# Patient Record
Sex: Female | Born: 1980 | Race: White | Hispanic: No | Marital: Married | Smoking: Never smoker
Health system: Southern US, Community
[De-identification: ages and names within clinical notes are randomized; demographics above are authoritative.]

## PROBLEM LIST (undated history)

## (undated) DIAGNOSIS — M069 Rheumatoid arthritis, unspecified: Secondary | ICD-10-CM

## (undated) DIAGNOSIS — E282 Polycystic ovarian syndrome: Secondary | ICD-10-CM

## (undated) HISTORY — PX: FRACTURE SURGERY: SHX138

## (undated) HISTORY — PX: ABDOMINAL SURGERY: SHX537

## (undated) HISTORY — PX: KNEE SURGERY: SHX244

---

## 2021-10-20 ENCOUNTER — Ambulatory Visit
Admission: RE | Admit: 2021-10-20 | Discharge: 2021-10-20 | Disposition: A | Discharge status: Home / Self Care | Payer: Managed Care, Other (non HMO) | Attending: Family Medicine | Admitting: Family Medicine

## 2021-10-20 ENCOUNTER — Ambulatory Visit
Admission: RE | Admit: 2021-10-20 | Discharge: 2021-10-20 | Disposition: A | Discharge status: Home / Self Care | Payer: Managed Care, Other (non HMO) | Source: Ambulatory Visit | Attending: Family Medicine | Admitting: Family Medicine

## 2021-10-20 ENCOUNTER — Other Ambulatory Visit: Payer: Self-pay | Admitting: Family Medicine

## 2021-10-20 DIAGNOSIS — R0602 Shortness of breath: Secondary | ICD-10-CM | POA: Insufficient documentation

## 2021-10-20 DIAGNOSIS — J40 Bronchitis, not specified as acute or chronic: Secondary | ICD-10-CM

## 2021-10-20 DIAGNOSIS — R059 Cough, unspecified: Secondary | ICD-10-CM | POA: Diagnosis present

## 2022-04-30 ENCOUNTER — Encounter: Payer: Self-pay | Admitting: Emergency Medicine

## 2022-04-30 ENCOUNTER — Ambulatory Visit
Admission: EM | Admit: 2022-04-30 | Discharge: 2022-04-30 | Disposition: A | Discharge status: Home / Self Care | Payer: Managed Care, Other (non HMO)

## 2022-04-30 DIAGNOSIS — J069 Acute upper respiratory infection, unspecified: Secondary | ICD-10-CM | POA: Diagnosis not present

## 2022-04-30 DIAGNOSIS — H6502 Acute serous otitis media, left ear: Secondary | ICD-10-CM

## 2022-04-30 HISTORY — DX: Polycystic ovarian syndrome: E28.2

## 2022-04-30 HISTORY — DX: Rheumatoid arthritis, unspecified: M06.9

## 2022-04-30 MED ORDER — PREDNISONE 20 MG PO TABS: 40.0000 mg | ORAL_TABLET | Freq: Every day | ORAL | 0 refills | Status: DC

## 2022-04-30 MED ORDER — BENZONATATE 100 MG PO CAPS: 100.0000 mg | ORAL_CAPSULE | Freq: Three times a day (TID) | ORAL | 0 refills | Status: DC

## 2022-04-30 MED ORDER — HYDROCOD POLI-CHLORPHE POLI ER 10-8 MG/5ML PO SUER: 5.0000 mL | Freq: Two times a day (BID) | ORAL | 0 refills | Status: DC | PRN

## 2022-04-30 MED ORDER — AMOXICILLIN 500 MG PO CAPS: 500.0000 mg | ORAL_CAPSULE | Freq: Two times a day (BID) | ORAL | 0 refills | Status: AC

## 2022-04-30 NOTE — ED Triage Notes (Signed)
Patient c/o cough and chest congestion for a week.  Patient also reports nasal congestion and pressure.  Patient denies fevers.

## 2022-04-30 NOTE — Discharge Instructions (Signed)
On exam there is redness to your left eardrum which is a sign of bacteria, take amoxicillin every morning and every evening for 10 days  For your wheezing and shortness of breath, begin prednisone every morning with food for 5 days  May use Tessalon pill every 8 hours as needed for cough  May use cough syrup twice daily as needed, be mindful this medication may make you drowsy  Continue use of inhaler    You can take Tylenol and/or Ibuprofen as needed for fever reduction and pain relief.   For cough: honey 1/2 to 1 teaspoon (you can dilute the honey in water or another fluid).  You can also use guaifenesin and dextromethorphan for cough. You can use a humidifier for chest congestion and cough.  If you don't have a humidifier, you can sit in the bathroom with the hot shower running.      For sore throat: try warm salt water gargles, cepacol lozenges, throat spray, warm tea or water with lemon/honey, popsicles or ice, or OTC cold relief medicine for throat discomfort.   For congestion: take a daily anti-histamine like Zyrtec, Claritin, and a oral decongestant, such as pseudoephedrine.  You can also use Flonase 1-2 sprays in each nostril daily.   It is important to stay hydrated: drink plenty of fluids (water, gatorade/powerade/pedialyte, juices, or teas) to keep your throat moisturized and help further relieve irritation/discomfort.

## 2022-04-30 NOTE — ED Provider Notes (Signed)
[facility]    CSN: [id number] Arrival date & time: 04/30/22  1108      History   Chief Complaint Chief Complaint  Patient presents with   Cough    HPI Katherine Galloway is a 41 y.o. female.   Patient presents with nasal congestion, rhinorrhea, sore throat, a nonproductive cough, intermittent shortness of breath with exertion and intermittent wheezing for 8 days.  Has begun to experience bilateral ear pain and fullness beginning 1 day ago.  No known exposure.  Tolerating food and liquids.  Denies fever, chills, body aches.  Has attempted use of albuterol inhaler which has been helpful.  Has attempted use of Promethazine DM which has been ineffective.  History of PCOS and rheumatoid arthritis.    Past Medical History:  Diagnosis Date   PCOS (polycystic ovarian syndrome)    Rheumatoid arthritis ([place])     There are no problems to display for this patient.   Past Surgical History:  Procedure Laterality Date   ABDOMINAL SURGERY     FRACTURE SURGERY     KNEE SURGERY Left     OB History   No obstetric history on file.      Home Medications    Prior to Admission medications   Medication Sig Start Date End Date Taking? Authorizing Provider  albuterol (VENTOLIN HFA) 108 (90 Base) MCG/ACT inhaler Inhale into the lungs. 09/30/21 09/30/22 Yes [provider]  Dietary Management Product (RHEUMATE) CAPS 1 capsule by oral route once daily 10/19/21  Yes [provider]  diltiazem (CARDIZEM) 30 MG tablet Take by mouth. 01/20/22 01/20/23 Yes [provider]  metFORMIN (GLUCOPHAGE) 500 MG tablet Take 1,000 mg by mouth 2 (two) times daily with a meal.   Yes [provider]  metoprolol succinate (TOPROL-XL) 25 MG 24 hr tablet Take by mouth. 07/07/21  Yes [provider]  montelukast (SINGULAIR) 10 MG tablet take 1 tablet by oral route nightly 01/13/22  Yes [provider]  fexofenadine-pseudoephedrine (ALLEGRA-D) 60-120 MG 12 hr  tablet Take by mouth.    [provider]  FLUoxetine (PROZAC) 20 MG capsule Take 20 mg by mouth daily. 03/19/22   [provider]  Multiple Vitamin (MULTI-VITAMIN) tablet Take by mouth.    [provider]  RASUVO 20 MG/0.4ML SOAJ Inject into the skin. 02/08/22   [provider]  rizatriptan (MAXALT) 5 MG tablet TAKE 1 TAB AS NEEDED FOR MIGRAINE FOR UP TO 1 DOSE MAY TAKE A SECOND DOSE AFTER 2 HOURS IF NEEDED.    [provider]  XELJANZ XR 11 MG TB24 Take 1 tablet by mouth daily. 01/26/22   [provider]    Family History History reviewed. No pertinent family history.  Social History Social History   Tobacco Use   Smoking status: Never   Smokeless tobacco: Never  Vaping Use   Vaping Use: Never used  Substance Use Topics   Alcohol use: Yes   Drug use: Never     Allergies   Patient has no known allergies.   Review of Systems Review of Systems  HENT:  Positive for congestion, ear pain, rhinorrhea and sore throat. Negative for dental problem, drooling, ear discharge, facial swelling, hearing loss, mouth sores, nosebleeds, postnasal drip, sinus pressure, sinus pain, sneezing, tinnitus, trouble swallowing and voice change.   Respiratory:  Positive for cough, shortness of breath and wheezing. Negative for apnea, choking, chest tightness and stridor.      Physical Exam Triage Vital Signs ED  Triage Vitals  Enc Vitals Group     BP 04/30/22 1121 (!) 143/83     Pulse Rate 04/30/22 1121 71     Resp 04/30/22 1121 14     Temp 04/30/22 1121 97.7 F (36.5 C)     Temp Source 04/30/22 1121 Oral     SpO2 04/30/22 1121 96 %     Weight 04/30/22 1115 235 lb (106.6 kg)     Height 04/30/22 1115 5\' 8"  (1.727 m)     Head Circumference --      Peak Flow --      Pain Score 04/30/22 1115 0     Pain Loc --      Pain Edu? --      Excl. in GC? --    No data found.  Updated Vital Signs BP (!) 143/83 (BP Location: Left Arm)   Pulse 71    Temp 97.7 F (36.5 C) (Oral)   Resp 14   Ht 5\' 8"  (1.727 m)   Wt 235 lb (106.6 kg)   LMP 04/28/2022 (Exact Date)   SpO2 96%   BMI 35.73 kg/m   Visual Acuity Right Eye Distance:   Left Eye Distance:   Bilateral Distance:    Right Eye Near:   Left Eye Near:    Bilateral Near:     Physical Exam Constitutional:      Appearance: Normal appearance.  HENT:     Head: Normocephalic.     Right Ear: Hearing, ear canal and external ear normal. A middle ear effusion is present.     Left Ear: Hearing, ear canal and external ear normal. Tympanic membrane is erythematous.     Nose: Congestion and rhinorrhea present.     Mouth/Throat:     Mouth: Mucous membranes are moist.     Pharynx: Oropharynx is clear.  Eyes:     Extraocular Movements: Extraocular movements intact.  Cardiovascular:     Rate and Rhythm: Normal rate and regular rhythm.     Pulses: Normal pulses.     Heart sounds: Normal heart sounds.  Pulmonary:     Effort: Pulmonary effort is normal.     Breath sounds: Normal breath sounds.  Neurological:     Mental Status: She is alert and oriented to person, place, and time. Mental status is at baseline.  Psychiatric:        Mood and Affect: Mood normal.        Behavior: Behavior normal.      UC Treatments / Results  Labs (all labs ordered are listed, but only abnormal results are displayed) Labs Reviewed - No data to display  EKG   Radiology No results found.  Procedures Procedures (including critical care time)  Medications Ordered in UC Medications - No data to display  Initial Impression / Assessment and Plan / UC Course  I have reviewed the triage vital signs and the nursing notes.  Pertinent labs & imaging results that were available during my care of the patient were reviewed by me and considered in my medical decision making (see chart for details).  Nonrecurrent acute serous otitis media of left ear, viral URI with cough  Patient is in no signs of  distress nor toxic appearing.  Vital signs are stable.  Low suspicion for pneumonia, pneumothorax or bronchitis and therefore will defer imaging.  Etiology of symptoms is most likely viral however on exam there is significant erythema to the left tympanic membrane and therefore antibiotics will be  started  Prescribed amoxicillin and prescribed prednisone, Tessalon and Tussionex for management of coughing   May use additional over-the-counter medications as needed for supportive care.  May follow-up with urgent care as needed if symptoms persist or worsen.   Final Clinical Impressions(s) / UC Diagnoses   Final diagnoses:  None   Discharge Instructions   None    ED Prescriptions   None    PDMP not reviewed this encounter.   Valinda Hoar, NP 04/30/22 1155

## 2023-01-14 ENCOUNTER — Ambulatory Visit
Admission: EM | Admit: 2023-01-14 | Discharge: 2023-01-14 | Disposition: A | Discharge status: Home / Self Care | Payer: Managed Care, Other (non HMO)

## 2023-01-14 ENCOUNTER — Encounter: Payer: Self-pay | Admitting: *Deleted

## 2023-01-14 ENCOUNTER — Other Ambulatory Visit: Payer: Self-pay

## 2023-01-14 DIAGNOSIS — J069 Acute upper respiratory infection, unspecified: Secondary | ICD-10-CM | POA: Diagnosis not present

## 2023-01-14 MED ORDER — PROMETHAZINE-DM 6.25-15 MG/5ML PO SYRP: 5.0000 mL | ORAL_SOLUTION | Freq: Four times a day (QID) | ORAL | 0 refills | Status: AC | PRN

## 2023-01-14 MED ORDER — BENZONATATE 100 MG PO CAPS: 200.0000 mg | ORAL_CAPSULE | Freq: Three times a day (TID) | ORAL | 0 refills | Status: AC | PRN

## 2023-01-14 NOTE — ED Triage Notes (Signed)
Pt reports she has had a summer cold with a cough. Pt reports cough is getting worse.

## 2023-01-14 NOTE — Discharge Instructions (Addendum)
-  Take Tessalon Perles 200 mg every 8 hours for cough. -Take promethazine DM every 4 hours as needed for cough for 5 days -Continue using the over-the-counter nasal spray prescribed by your other providers for nasal congestion. -Report new or worsening symptoms to your primary care provider or local emergency room.

## 2023-01-14 NOTE — ED Provider Notes (Signed)
[facility]    CSN: [id number] Arrival date & time: 01/14/23  1014      History   Chief Complaint Chief Complaint  Patient presents with   Cough   HPI Katherine Galloway is a 42 y.o. female presents the urgent care this morning with complaints of cough.  Patient reports that symptoms started about 12 days ago as she was also having runny nose which resolved but continues to have dry cough to the point it is preventing her from sleeping at night. Patient reports her husband got sick with the same symptoms, but has already recovered. Patient continues to have increased dry cough and some neck muscle tenderness from coughing.  She took Mucinex DM for 1 week, not anymore, she is currently taking NyQuil and DayQuil with minimal relief. Reports no fever, chest pain, chills, allergies, nausea or vomiting.  She feels tired from a coughing as she stays awake due to increased cough.  Patient reports seasonal allergies as well.  She has a past medical history of PCOS, and rheumatoid arthritis with upcoming appointment with a new rheumatologist in October.  Reports using a spray from over-the-counter.  Past Medical History:  Diagnosis Date   PCOS (polycystic ovarian syndrome)    Rheumatoid arthritis ([facility])     There are no problems to display for this patient.   Past Surgical History:  Procedure Laterality Date   ABDOMINAL SURGERY     FRACTURE SURGERY     KNEE SURGERY Left     OB History   No obstetric history on file.      Home Medications    Prior to Admission medications   Medication Sig Start Date End Date Taking? Authorizing Provider  benzonatate (TESSALON) 100 MG capsule Take 2 capsules (200 mg total) by mouth 3 (three) times daily as needed for up to 7 days for cough. 01/14/23 01/21/23 Yes Eleonore Chiquito, FNP  diltiazem (CARDIZEM) 30 MG tablet Take by mouth. 01/20/22 01/20/23 Yes [provider]  fexofenadine-pseudoephedrine (ALLEGRA-D) 60-120 MG 12 hr tablet Take  by mouth.   Yes [provider]  leflunomide (ARAVA) 20 MG tablet Take 20 mg by mouth daily.   Yes [provider]  metoprolol succinate (TOPROL-XL) 25 MG 24 hr tablet Take by mouth. 07/07/21  Yes [provider]  montelukast (SINGULAIR) 10 MG tablet take 1 tablet by oral route nightly 01/13/22  Yes [provider]  Multiple Vitamin (MULTI-VITAMIN) tablet Take by mouth.   Yes [provider]  promethazine-dextromethorphan (PROMETHAZINE-DM) 6.25-15 MG/5ML syrup Take 5 mLs by mouth 4 (four) times daily as needed for up to 5 days for cough. 01/14/23 01/19/23 Yes Eleonore Chiquito, FNP  rizatriptan (MAXALT) 5 MG tablet TAKE 1 TAB AS NEEDED FOR MIGRAINE FOR UP TO 1 DOSE MAY TAKE A SECOND DOSE AFTER 2 HOURS IF NEEDED.   Yes [provider]  XELJANZ XR 11 MG TB24 Take 1 tablet by mouth daily. 01/26/22  Yes [provider]  albuterol (VENTOLIN HFA) 108 (90 Base) MCG/ACT inhaler Inhale into the lungs. 09/30/21 09/30/22  [provider]  Dietary Management Product (RHEUMATE) CAPS 1 capsule by oral route once daily 10/19/21   [provider]  FLUoxetine (PROZAC) 20 MG capsule Take 20 mg by mouth daily. 03/19/22   [provider]  metFORMIN (GLUCOPHAGE) 500 MG tablet Take 1,000 mg by mouth 2 (two) times daily with a meal.    [provider]  predniSONE (DELTASONE) 20 MG tablet Take 2 tablets (  40 mg total) by mouth daily. 04/30/22   White, Adrienne R, NP  RASUVO 20 MG/0.4ML SOAJ Inject into the skin. 02/08/22   [provider]    Family History History reviewed. No pertinent family history.  Social History Social History   Tobacco Use   Smoking status: Never   Smokeless tobacco: Never  Vaping Use   Vaping Use: Never used  Substance Use Topics   Alcohol use: Yes   Drug use: Never     Allergies   Other   Review of Systems Review of Systems  Constitutional:  Positive for fatigue.  HENT:  Positive for  congestion.   Eyes: Negative.   Respiratory:  Positive for cough.   Cardiovascular: Negative.   Gastrointestinal: Negative.   Endocrine: Negative.   Genitourinary: Negative.   Musculoskeletal: Negative.    Physical Exam Triage Vital Signs ED Triage Vitals  Enc Vitals Group     BP 01/14/23 1115 (!) 143/95     Pulse Rate 01/14/23 1115 75     Resp 01/14/23 1115 18     Temp 01/14/23 1115 98.3 F (36.8 C)     Temp src --      SpO2 01/14/23 1115 91 %     Weight --      Height --      Head Circumference --      Peak Flow --      Pain Score 01/14/23 1109 0     Pain Loc --      Pain Edu? --      Excl. in GC? --    No data found.  Updated Vital Signs BP (!) 143/95   Pulse 75   Temp 98.3 F (36.8 C)   Resp 18   SpO2 91%   Visual Acuity Right Eye Distance:   Left Eye Distance:   Bilateral Distance:    Right Eye Near:   Left Eye Near:    Bilateral Near:     Physical Exam Constitutional:      Appearance: Normal appearance.  HENT:     Head: Normocephalic and atraumatic.     Right Ear: Tympanic membrane, ear canal and external ear normal.     Left Ear: Tympanic membrane, ear canal and external ear normal.     Nose: Congestion present.  Eyes:     Pupils: Pupils are equal, round, and reactive to light.  Cardiovascular:     Rate and Rhythm: Normal rate and regular rhythm.  Pulmonary:     Breath sounds: Normal breath sounds.     Comments: Intermittent dry cough appreciated. Abdominal:     General: Bowel sounds are normal.  Musculoskeletal:     Cervical back: Normal range of motion and neck supple.  Neurological:     Mental Status: She is alert and oriented to person, place, and time.  Psychiatric:        Mood and Affect: Mood normal.        Behavior: Behavior normal.      UC Treatments / Results  Labs (all labs ordered are listed, but only abnormal results are displayed) Labs Reviewed - No data to display  EKG   Radiology No results  found.  Procedures Procedures (including critical care time)  Medications Ordered in UC Medications - No data to display  Initial Impression / Assessment and Plan / UC Course  I have reviewed the triage vital signs and the nursing notes.  Pertinent labs & imaging results that were available  during my care of the patient were reviewed by me and considered in my medical decision making (see chart for details).    Ms. Sabatini is a 42 year old female who presented to the urgent care this morning with dry cough x 12 days. She is diagnosed with viral upper respiratory infection with cough and is prescribed benzonatate 200 mg 3 times a day for cough x 7 days, Promethazine DM every 4 hours as needed for cough for 5 days.  She is also reminded to continue taking her over-the-counter nasal spray prescribed by her ENT providers. Reminded to take Tylenol as needed for pain and report new or worsening symptoms to her primary care provider office or local emergency room and stay hydrated.  All her questions were answered and patient verbalized understanding to the above instructions. Final Clinical Impressions(s) / UC Diagnoses   Final diagnoses:  Viral URI with cough     Discharge Instructions      -Take Tessalon Perles 200 mg every 8 hours for cough. -Take promethazine DM every 4 hours as needed for cough for 5 days -Continue using the over-the-counter nasal spray prescribed by your other providers for nasal congestion. -Report new or worsening symptoms to your primary care provider or local emergency room.    ED Prescriptions     Medication Sig Dispense Auth. Provider   benzonatate (TESSALON) 100 MG capsule Take 2 capsules (200 mg total) by mouth 3 (three) times daily as needed for up to 7 days for cough. 21 capsule Eleonore Chiquito, FNP   promethazine-dextromethorphan (PROMETHAZINE-DM) 6.25-15 MG/5ML syrup Take 5 mLs by mouth 4 (four) times daily as needed for up to 5 days for cough. 70 mL  Eleonore Chiquito, FNP      PDMP not reviewed this encounter.   Eleonore Chiquito, FNP 01/14/23 1204

## 2023-05-10 ENCOUNTER — Ambulatory Visit
Admission: EM | Admit: 2023-05-10 | Discharge: 2023-05-10 | Disposition: A | Discharge status: Home / Self Care | Payer: Managed Care, Other (non HMO) | Attending: Internal Medicine | Admitting: Internal Medicine

## 2023-05-10 DIAGNOSIS — M05772 Rheumatoid arthritis with rheumatoid factor of left ankle and foot without organ or systems involvement: Secondary | ICD-10-CM

## 2023-05-10 MED ORDER — PREDNISONE 10 MG (21) PO TBPK: ORAL_TABLET | Freq: Every day | ORAL | 0 refills | Status: AC

## 2023-05-10 NOTE — ED Provider Notes (Signed)
[facility]    CSN: [id number] Arrival date & time: 05/10/23  1144      History   Chief Complaint Chief Complaint  Patient presents with   Foot Pain    HPI Katherine Galloway is a 42 y.o. female presents for a rheumatoid arthritis flare.  Patient has a history of this and is currently on 2 different RA medications.  She reports last night she began developing pain and swelling in her left foot and is worsened today.  She states this is consistent with her RA flares.  She normally gets them in her hands and feet.  Denies any injury or numbness or tingling.  She did take ibuprofen this morning with marginal improvement of her symptoms.  She has an appointment with a new rheumatologist at the end of this month.  No other concerns at this time.   Foot Pain    Past Medical History:  Diagnosis Date   PCOS (polycystic ovarian syndrome)    Rheumatoid arthritis ([facility])     There are no problems to display for this patient.   Past Surgical History:  Procedure Laterality Date   ABDOMINAL SURGERY     FRACTURE SURGERY     KNEE SURGERY Left     OB History   No obstetric history on file.      Home Medications    Prior to Admission medications   Medication Sig Start Date End Date Taking? Authorizing Provider  predniSONE (STERAPRED UNI-PAK 21 TAB) 10 MG (21) TBPK tablet Take by mouth daily. Take 6 tabs by mouth daily  for 2 days, then 5 tabs for 2 days, then 4 tabs for 2 days, then 3 tabs for 2 days, 2 tabs for 2 days, then 1 tab by mouth daily for 2 days 05/10/23  Yes Radford Pax, NP  albuterol (VENTOLIN HFA) 108 (90 Base) MCG/ACT inhaler Inhale into the lungs. 09/30/21 09/30/22  [provider]  Dietary Management Product (RHEUMATE) CAPS 1 capsule by oral route once daily 10/19/21   [provider]  diltiazem (CARDIZEM) 30 MG tablet Take by mouth. 01/20/22 01/20/23  [provider]  fexofenadine-pseudoephedrine (ALLEGRA-D) 60-120 MG 12 hr tablet Take  by mouth.    [provider]  FLUoxetine (PROZAC) 20 MG capsule Take 20 mg by mouth daily. 03/19/22   [provider]  leflunomide (ARAVA) 20 MG tablet Take 20 mg by mouth daily.    [provider]  metFORMIN (GLUCOPHAGE) 500 MG tablet Take 1,000 mg by mouth 2 (two) times daily with a meal.    [provider]  metoprolol succinate (TOPROL-XL) 25 MG 24 hr tablet Take by mouth. 07/07/21   [provider]  montelukast (SINGULAIR) 10 MG tablet take 1 tablet by oral route nightly 01/13/22   [provider]  Multiple Vitamin (MULTI-VITAMIN) tablet Take by mouth.    [provider]  RASUVO 20 MG/0.4ML SOAJ Inject into the skin. 02/08/22   [provider]  rizatriptan (MAXALT) 5 MG tablet TAKE 1 TAB AS NEEDED FOR MIGRAINE FOR UP TO 1 DOSE MAY TAKE A SECOND DOSE AFTER 2 HOURS IF NEEDED.    [provider]  XELJANZ XR 11 MG TB24 Take 1 tablet by mouth daily. 01/26/22   [provider]    Family History History reviewed. No pertinent family history.  Social History Social History   Tobacco Use   Smoking status: Never   Smokeless tobacco: Never  Vaping Use   Vaping  status: Never Used  Substance Use Topics   Alcohol use: Yes   Drug use: Never     Allergies   Other and Agave   Review of Systems Review of Systems  Musculoskeletal:        Left foot pain swelling/RA flare     Physical Exam Triage Vital Signs ED Triage Vitals [05/10/23 1243]  Encounter Vitals Group     BP (!) 169/96     Systolic BP Percentile      Diastolic BP Percentile      Pulse Rate 64     Resp 16     Temp (!) 97.5 F (36.4 C)     Temp Source Oral     SpO2 96 %     Weight      Height      Head Circumference      Peak Flow      Pain Score 5     Pain Loc      Pain Education      Exclude from Growth Chart    No data found.  Updated Vital Signs BP (!) 145/85 (BP Location: Left Arm)   Pulse 64   Temp (!) 97.5 F (36.4  C) (Oral)   Resp 16   LMP 04/28/2022 (Exact Date)   SpO2 96%   Visual Acuity Right Eye Distance:   Left Eye Distance:   Bilateral Distance:    Right Eye Near:   Left Eye Near:    Bilateral Near:     Physical Exam Vitals and nursing note reviewed.  Constitutional:      General: She is not in acute distress.    Appearance: Normal appearance. She is not ill-appearing.  HENT:     Head: Normocephalic and atraumatic.  Eyes:     Pupils: Pupils are equal, round, and reactive to light.  Cardiovascular:     Rate and Rhythm: Normal rate.  Pulmonary:     Effort: Pulmonary effort is normal.  Feet:     Comments: There is mild swelling of the left foot without erythema or warmth.  Moderately tender to palpation primarily to dorsum of foot.  Skin is intact. Skin:    General: Skin is warm and dry.  Neurological:     General: No focal deficit present.     Mental Status: She is alert and oriented to person, place, and time.  Psychiatric:        Mood and Affect: Mood normal.        Behavior: Behavior normal.      UC Treatments / Results  Labs (all labs ordered are listed, but only abnormal results are displayed) Labs Reviewed - No data to display  EKG   Radiology No results found.  Procedures Procedures (including critical care time)  Medications Ordered in UC Medications - No data to display  Initial Impression / Assessment and Plan / UC Course  I have reviewed the triage vital signs and the nursing notes.  Pertinent labs & imaging results that were available during my care of the patient were reviewed by me and considered in my medical decision making (see chart for details).     Reviewed exam and symptoms with patient.  Patient with history of RA presenting with 1 day onset of left foot pain and swelling that patient reports is consistent with previous RA flares.  Will do prednisone taper as she has done well with this in the past.  Advised no NSAIDs while on this  medication.  May do OTC Tylenol as needed.  Will follow-up with her rheumatologist at her scheduled appointment at the end of this month.  Advised PCP follow-up 2 to 3 days for recheck.  ER precautions reviewed and patient verbalized understanding. 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  Please follow-up with your rheumatologist at your scheduled appointment this month.  You may take Tylenol over-the-counter as needed but do not take any over-the-counter NSAIDs such as ibuprofen, Advil, Aleve, naproxen while you are taking the prednisone due to bleeding risk.  Follow-up with your PCP in 2 to 3 days for recheck.  Please go to the ER for any worsening symptoms.  I hope you feel better soon!    ED Prescriptions     Medication Sig Dispense Auth. Provider   predniSONE (STERAPRED UNI-PAK 21 TAB) 10 MG (21) TBPK tablet Take by mouth daily. Take 6 tabs by mouth daily  for 2 days, then 5 tabs for 2 days, then 4 tabs for 2 days, then 3 tabs for 2 days, 2 tabs for 2 days, then 1 tab by mouth daily for 2 days 42 tablet Radford Pax, NP      PDMP not reviewed this encounter.   Radford Pax, NP 05/10/23 [phone]

## 2023-05-10 NOTE — Discharge Instructions (Addendum)
Start prednisone as prescribed.  Please follow-up with your rheumatologist at your scheduled appointment this month.  You may take Tylenol over-the-counter as needed but do not take any over-the-counter NSAIDs such as ibuprofen, Advil, Aleve, naproxen while you are taking the prednisone due to bleeding risk.  Follow-up with your PCP in 2 to 3 days for recheck.  Please go to the ER for any worsening symptoms.  I hope you feel better soon!

## 2023-05-10 NOTE — ED Triage Notes (Signed)
Patient presents to [facility] for left foot pain since this morning. States pain is from RA flare-up. Treating pain with ibuprofen. Last dose 800 mg at approx 0600 this morning.

## 2023-09-04 IMAGING — CR DG CHEST 2V
2 series · 2 of 2 positions shown · non-contrast
Comparison: None.

CLINICAL DATA: Cough and shortness of breath for 1 month.

EXAM:
CHEST - 2 VIEW

[chest pa]
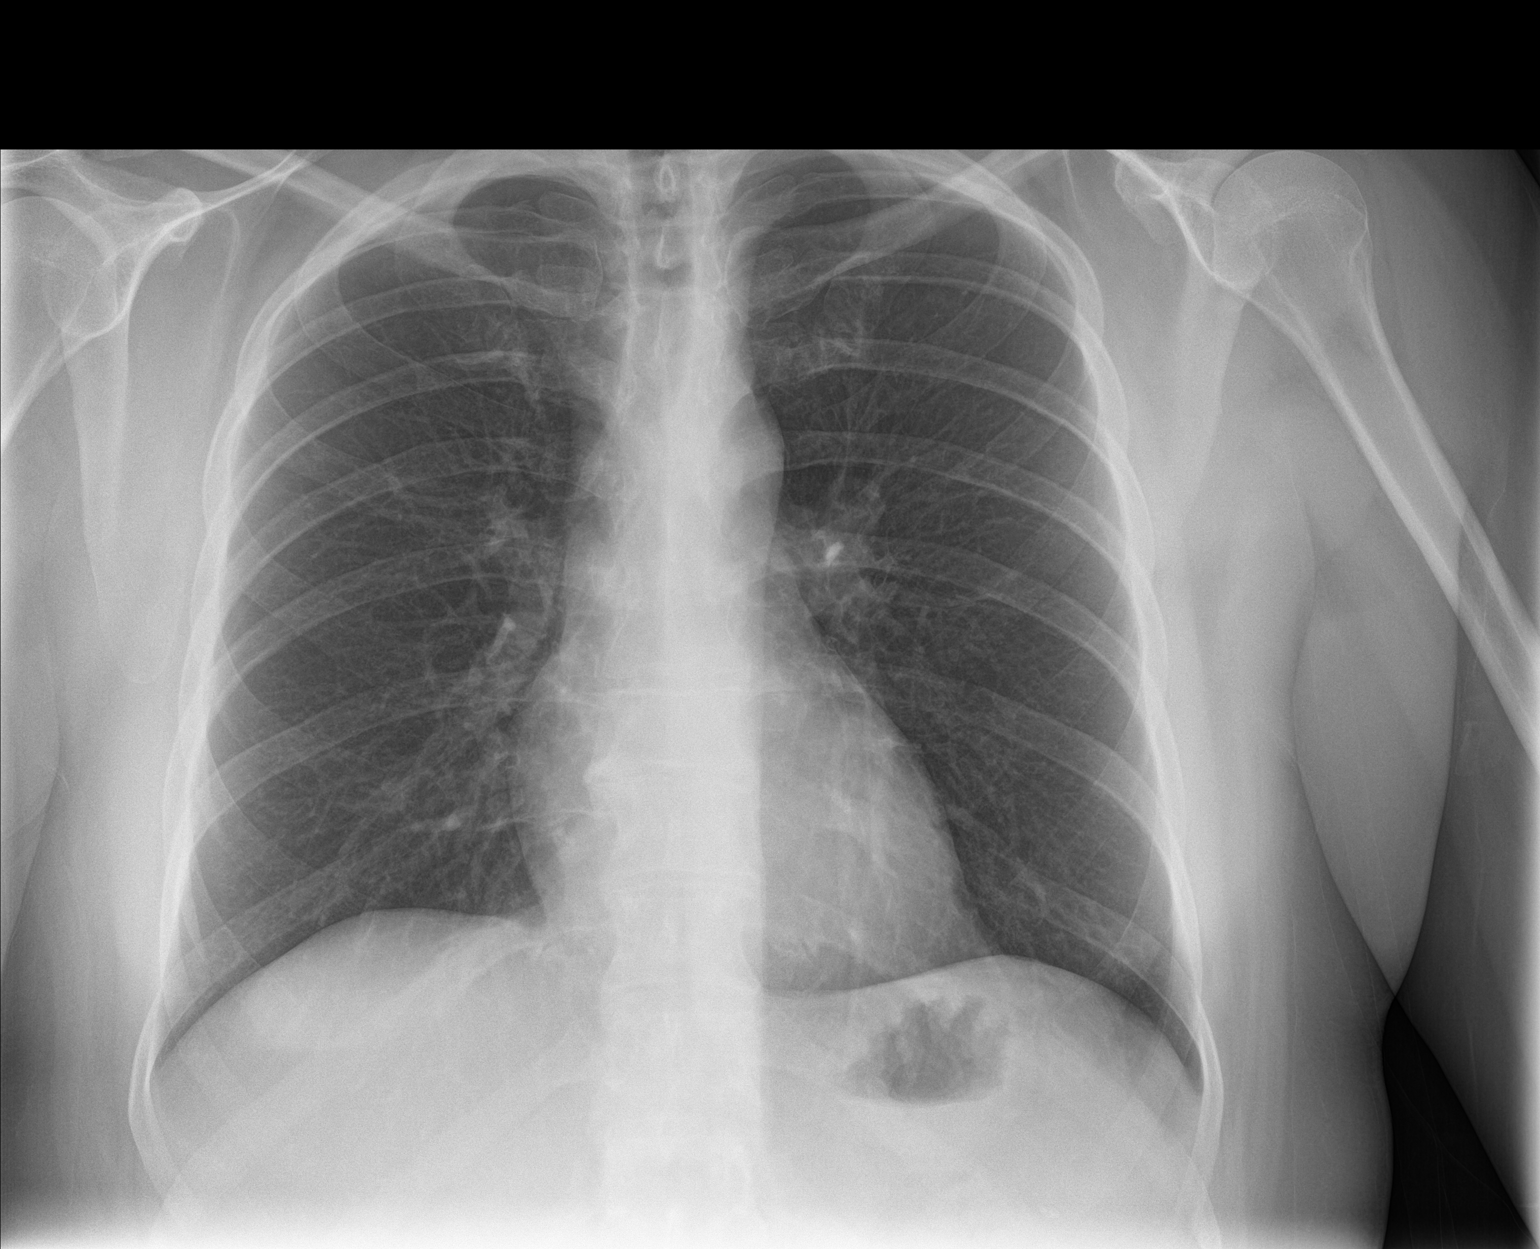

[chest lat]
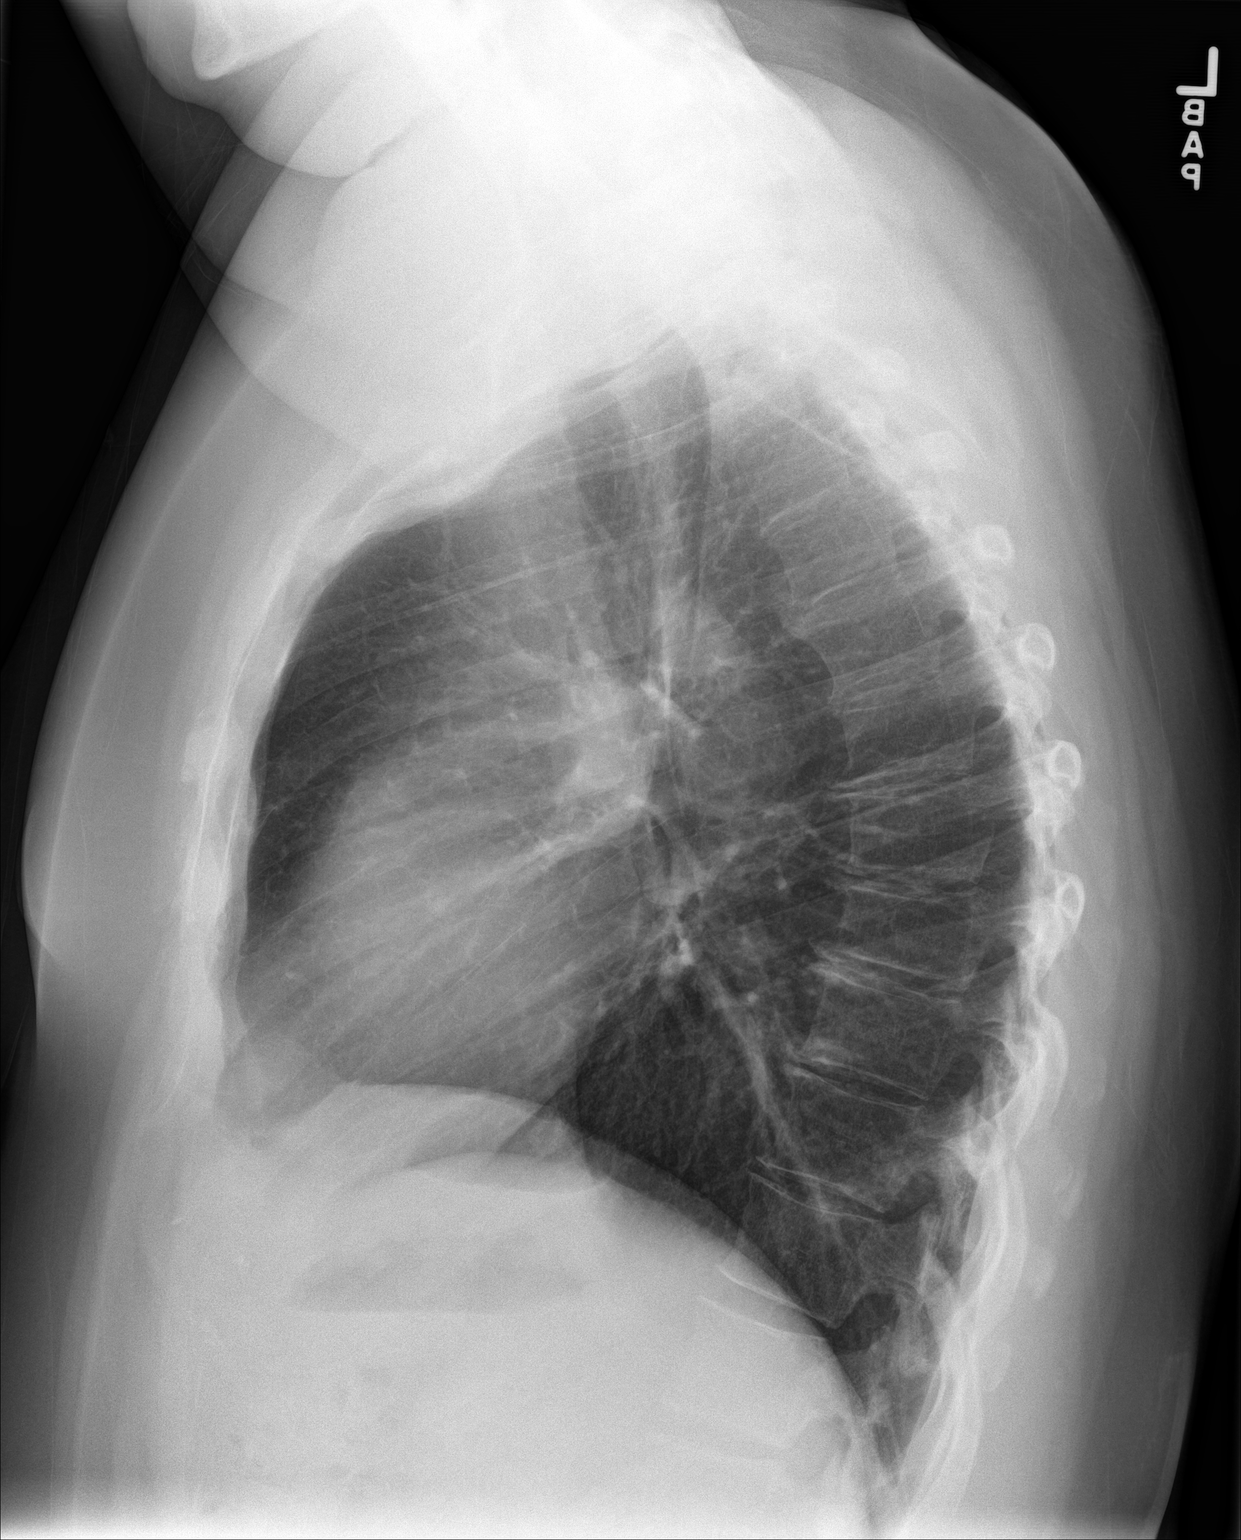

[2 of 2 positions shown; findings below may reference images not displayed]

FINDINGS: The heart size and mediastinal contours are within normal limits.
Both lungs are clear. The visualized skeletal structures are
unremarkable.
IMPRESSION: No active cardiopulmonary disease.
